# Patient Record
Sex: Male | Born: 1960 | Race: Black or African American | Hispanic: No | Marital: Married | Smoking: Never smoker
Health system: Southern US, Community
[De-identification: ages and names within clinical notes are randomized; demographics above are authoritative.]

## PROBLEM LIST (undated history)

## (undated) DIAGNOSIS — E119 Type 2 diabetes mellitus without complications: Secondary | ICD-10-CM

## (undated) DIAGNOSIS — I10 Essential (primary) hypertension: Secondary | ICD-10-CM

---

## 2004-02-09 DIAGNOSIS — E119 Type 2 diabetes mellitus without complications: Secondary | ICD-10-CM | POA: Insufficient documentation

## 2012-10-31 DIAGNOSIS — E7800 Pure hypercholesterolemia, unspecified: Secondary | ICD-10-CM | POA: Insufficient documentation

## 2014-01-11 DIAGNOSIS — I10 Essential (primary) hypertension: Secondary | ICD-10-CM | POA: Insufficient documentation

## 2015-08-20 ENCOUNTER — Encounter: Payer: Self-pay | Admitting: Gynecology

## 2015-08-20 ENCOUNTER — Ambulatory Visit
Admission: EM | Admit: 2015-08-20 | Discharge: 2015-08-20 | Disposition: A | Discharge status: Home / Self Care | Payer: Self-pay | Attending: Internal Medicine | Admitting: Internal Medicine

## 2015-08-20 ENCOUNTER — Ambulatory Visit: Payer: Self-pay

## 2015-08-20 DIAGNOSIS — S32029A Unspecified fracture of second lumbar vertebra, initial encounter for closed fracture: Secondary | ICD-10-CM

## 2015-08-20 DIAGNOSIS — M791 Myalgia: Secondary | ICD-10-CM

## 2015-08-20 DIAGNOSIS — R52 Pain, unspecified: Secondary | ICD-10-CM

## 2015-08-20 DIAGNOSIS — M7918 Myalgia, other site: Secondary | ICD-10-CM

## 2015-08-20 HISTORY — DX: Essential (primary) hypertension: I10

## 2015-08-20 HISTORY — DX: Type 2 diabetes mellitus without complications: E11.9

## 2015-08-20 MED ORDER — HYDROCODONE-ACETAMINOPHEN 5-325 MG PO TABS: 2.0000 | ORAL_TABLET | ORAL | Status: AC | PRN

## 2015-08-20 NOTE — Discharge Instructions (Signed)
Prescription for a small number of vicodin, for severe pain, was given. Take motrin as needed for pain.   Anticipate stiffness/soreness may get worse for the next day or two, then gradually improve after that. Apply ice to sore areas 2-4 times daily for 10-15 minutes, then use heat or ice as needed.  Reviewed result of low back xray with patient and wife, with L2 fracture and recommendation to go to ER for CT scan of back, to determine whether fragments are present and proximity to spinal cord.
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
## 2015-08-23 ENCOUNTER — Telehealth: Payer: Self-pay | Admitting: Internal Medicine

## 2015-08-23 DIAGNOSIS — S32029A Unspecified fracture of second lumbar vertebra, initial encounter for closed fracture: Secondary | ICD-10-CM
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
## 2023-04-30 ENCOUNTER — Ambulatory Visit
Admission: EM | Admit: 2023-04-30 | Discharge: 2023-04-30 | Disposition: A | Discharge status: Home / Self Care | Payer: BLUE CROSS/BLUE SHIELD

## 2023-04-30 ENCOUNTER — Ambulatory Visit (INDEPENDENT_AMBULATORY_CARE_PROVIDER_SITE_OTHER): Payer: BLUE CROSS/BLUE SHIELD

## 2023-04-30 DIAGNOSIS — S60011A Contusion of right thumb without damage to nail, initial encounter: Secondary | ICD-10-CM

## 2023-04-30 NOTE — Discharge Instructions (Addendum)
Your x-rays did not demonstrate any evidence of broken bones or dislocations.  You do have significant arthritis in the joints where you are having pain.  It is possible that you have just bruised the tissues and the swelling is what is causing your pain and decreased range of motion.  Take over-the-counter Tylenol and/or ibuprofen according to the package instructions as needed for pain and inflammation.  Wear the thumb spica splint to help keep your thumb in a neutral position and protect it from further injury.  Take the splint off once or twice a day and put your thumb through a range of motion to help maintain function.  I have given you the contact information for Dr. Mathis Bud at [facility], she is a hand specialist, if your symptoms continue, or your pain worsens, I would call her and make an appointment for further evaluation.  You may also apply ice or moist heat to your thumb for 20 minutes at a time 2-3 times a day to help with pain and inflammation.

## 2023-04-30 NOTE — ED Provider Notes (Signed)
[facility]    CSN: [id number] Arrival date & time: 04/30/23  0817      History   Chief Complaint Chief Complaint  Patient presents with   Hand Pain    HPI Kenneth Huber is a 62 y.o. male.   HPI  10 old male with past medical history significant for diabetes and hypertension presents for evaluation of pain, swelling, and redness in his right thumb.  He states he is also unable to bend the thumb.  Triage note states that the pain started yesterday.  The patient reports that he suffered a ground-level fall and is unsure of how he landed but he did land on his right hand.  He denies any numbness or tingling.    Past Medical History:  Diagnosis Date   Diabetes mellitus without complication (HCC)    Hypertension     Patient Active Problem List   Diagnosis Date Noted   HTN (hypertension) 01/11/2014   Hypercholesteremia 10/31/2012   Diabetes mellitus (HCC) 02/09/2004    History reviewed. No pertinent surgical history.     Home Medications    Prior to Admission medications   Medication Sig Start Date End Date Taking? Authorizing Provider  aspirin EC 81 MG tablet Take by mouth. 09/03/17  Yes [provider]  glipiZIDE (GLUCOTROL) 5 MG tablet Take by mouth. 08/13/22 08/13/23 Yes [provider]  losartan-hydrochlorothiazide (HYZAAR) 100-25 MG tablet Take 1 tablet by mouth daily. 06/12/22  Yes [provider]  metFORMIN (GLUCOPHAGE) 1000 MG tablet Take 1 tablet by mouth 2 (two) times daily with a meal. 02/20/22  Yes [provider]  atorvastatin (LIPITOR) 40 MG tablet Take 40 mg by mouth daily.    [provider]  HYDROcodone-acetaminophen (NORCO/VICODIN) 5-325 MG per tablet Take 2 tablets by mouth every 4 (four) hours as needed. 08/20/15   Isa Rankin, MD    Family History History reviewed. No pertinent family history.  Social History Social History   Tobacco Use   Smoking status: Never   Smokeless tobacco:  Never  Substance Use Topics   Alcohol use: No   Drug use: No     Allergies   Flonase [fluticasone propionate]   Review of Systems Review of Systems  Musculoskeletal:  Positive for arthralgias and joint swelling.  Skin:  Positive for color change.  Neurological:  Negative for numbness.     Physical Exam Triage Vital Signs ED Triage Vitals  Enc Vitals Group     BP 04/30/23 0846 (!) 159/82     Pulse Rate 04/30/23 0846 64     Resp 04/30/23 0846 18     Temp 04/30/23 0846 98.5 F (36.9 C)     Temp Source 04/30/23 0846 Oral     SpO2 04/30/23 0846 99 %     Weight --      Height --      Head Circumference --      Peak Flow --      Pain Score 04/30/23 0844 7     Pain Loc --      Pain Edu? --      Excl. in GC? --    No data found.  Updated Vital Signs BP (!) 159/82 (BP Location: Left Arm)   Pulse 64   Temp 98.5 F (36.9 C) (Oral)   Resp 18   SpO2 99%   Visual Acuity Right Eye Distance:   Left Eye Distance:   Bilateral Distance:    Right Eye Near:  Left Eye Near:    Bilateral Near:     Physical Exam Vitals and nursing note reviewed.  Constitutional:      Appearance: Normal appearance. He is not ill-appearing.  HENT:     Head: Normocephalic and atraumatic.  Musculoskeletal:        General: Swelling, tenderness and signs of injury present. No deformity.  Skin:    General: Skin is warm and dry.     Capillary Refill: Capillary refill takes less than 2 seconds.     Findings: Erythema present.  Neurological:     General: No focal deficit present.     Mental Status: He is alert and oriented to person, place, and time.      UC Treatments / Results  Labs (all labs ordered are listed, but only abnormal results are displayed) Labs Reviewed - No data to display  EKG   Radiology DG Finger Thumb Right  Result Date: 04/30/2023 CLINICAL DATA:  Thumb pain. Pain and swelling in the metacarpophalangeal joint. Overlying redness and edema. Status post fall  yesterday. EXAM: RIGHT THUMB 2+V COMPARISON:  None Available. FINDINGS: There is moderate thumb interphalangeal joint space narrowing and peripheral osteophytosis. Mild predominantly lateral thumb metacarpophalangeal joint space narrowing. Moderate thumb carpometacarpal joint space narrowing with mild peripheral osteophytosis. Mild triscaphe joint space narrowing with distal lateral scaphoid degenerative osteophytosis. Mild-to-moderate index finger metacarpophalangeal joint space narrowing and peripheral osteophytosis. No acute fracture or dislocation. IMPRESSION: 1. No acute fracture. 2. 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 Electronically Signed   By: Neita Garnet M.D.   On: 04/30/2023 09:41    Procedures Procedures (including critical care time)  Medications Ordered in UC Medications - No data to display  Initial Impression / Assessment and Plan / UC Course  I have reviewed the triage vital signs and the nursing notes.  Pertinent labs & imaging results that were available during my care of the patient were reviewed by me and considered in my medical decision making (see chart for details).   Patient is a pleasant, nontoxic-appearing 62 year old male presenting for evaluation of pain and limited range of motion in his right thumb after suffering a ground-level fall yesterday.  As you can see in image above there is overlying erythema to the MCP joint, webspace between the first and second digit, and overlying the first metacarpal of the right hand.  The patient has mild tenderness to the proximal phalanx, MCP joint, and metacarpal of the thumb.  The IP joint and distal phalanx are unremarkable.  Sensation is intact.  He has limited flexion and extension of his thumb and is unclear if this is secondary to some form of ligamentous or tendon injury or secondary to the edema that is present.  The remainder of his hands and fingers are free of tenderness and patient  has full range of motion of the remainder of his digits.  I will obtain radiograph of his right thumb to evaluate for bony injury.  Right thumb x-rays independently reviewed and evaluated by me.  Impression: There is a questionable, noted, mildly displaced fracture of the distal aspect of the proximal phalanx of the thumb.  No other bony abnormality or dislocations noted.  Radiology overread is pending. Radiology impression states that there is no acute fracture but there is mild to moderate thumb carpometacarpal and interphalangeal osteoarthritis.  I will discharge patient home with diagnosis of contusion of the right thumb and placed him in a thumb spica splint.  I will also refer  him to Dr. Mathis Bud for further evaluation and treatment given his level of decreased range of motion.  Final Clinical Impressions(s) / UC Diagnoses   Final diagnoses:  Contusion of right thumb without damage to nail, initial encounter     Discharge Instructions      Your x-rays did not demonstrate any evidence of broken bones or dislocations.  You do have significant arthritis in the joints where you are having pain.  It is possible that you have just bruised the tissues and the swelling is what is causing your pain and decreased range of motion.  Take over-the-counter Tylenol and/or ibuprofen according to the package instructions as needed for pain and inflammation.  Wear the thumb spica splint to help keep your thumb in a neutral position and protect it from further injury.  Take the splint off once or twice a day and put your thumb through a range of motion to help maintain function.  I have given you the contact information for Dr. Mathis Bud at [facility], she is a hand specialist, if your symptoms continue, or your pain worsens, I would call her and make an appointment for further evaluation.  You may also apply ice or moist heat to your thumb for 20 minutes at a time 2-3 times a day to help  with pain and inflammation.     ED Prescriptions   None    PDMP not reviewed this encounter.   Becky Augusta, NP 04/30/23 [phone]

## 2023-04-30 NOTE — ED Triage Notes (Signed)
Patient presents to [facility] for right hand pain since yesterday. He states he fell landing on his hand. States unable to bend thumb. Taking tylenol for pain.
